# Patient Record
Sex: Female | Born: 1980 | Race: Black or African American | Hispanic: No | Marital: Single | Smoking: Never smoker
Health system: Southern US, Community
[De-identification: ages and names within clinical notes are randomized; demographics above are authoritative.]

## PROBLEM LIST (undated history)

## (undated) DIAGNOSIS — E079 Disorder of thyroid, unspecified: Secondary | ICD-10-CM

## (undated) HISTORY — PX: THYROIDECTOMY: SHX17

---

## 2015-10-06 ENCOUNTER — Emergency Department (HOSPITAL_COMMUNITY): Payer: BC Managed Care – PPO

## 2015-10-06 ENCOUNTER — Emergency Department (HOSPITAL_COMMUNITY)
Admission: EM | Admit: 2015-10-06 | Discharge: 2015-10-07 | Disposition: A | Discharge status: Home / Self Care | Payer: BC Managed Care – PPO | Attending: Emergency Medicine | Admitting: Emergency Medicine

## 2015-10-06 ENCOUNTER — Encounter (HOSPITAL_COMMUNITY): Payer: Self-pay | Admitting: Emergency Medicine

## 2015-10-06 DIAGNOSIS — Y9389 Activity, other specified: Secondary | ICD-10-CM | POA: Diagnosis not present

## 2015-10-06 DIAGNOSIS — Y998 Other external cause status: Secondary | ICD-10-CM | POA: Insufficient documentation

## 2015-10-06 DIAGNOSIS — Y92410 Unspecified street and highway as the place of occurrence of the external cause: Secondary | ICD-10-CM | POA: Insufficient documentation

## 2015-10-06 DIAGNOSIS — M7918 Myalgia, other site: Secondary | ICD-10-CM

## 2015-10-06 DIAGNOSIS — Z331 Pregnant state, incidental: Secondary | ICD-10-CM | POA: Diagnosis not present

## 2015-10-06 DIAGNOSIS — S8992XA Unspecified injury of left lower leg, initial encounter: Secondary | ICD-10-CM | POA: Diagnosis present

## 2015-10-06 DIAGNOSIS — S79912A Unspecified injury of left hip, initial encounter: Secondary | ICD-10-CM | POA: Insufficient documentation

## 2015-10-06 LAB — POC URINE PREG, ED: Preg Test, Ur: POSITIVE — AB

## 2015-10-06 MED ORDER — IBUPROFEN 400 MG PO TABS
400.0000 mg | ORAL_TABLET | Freq: Once | ORAL | Status: AC
Start: 2015-10-06 — End: 2015-10-06
  Administered 2015-10-06: 400 mg via ORAL
  Filled 2015-10-06: qty 1

## 2015-10-06 MED ORDER — HYDROCODONE-ACETAMINOPHEN 5-325 MG PO TABS: ORAL_TABLET | ORAL | Status: AC
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
## 2015-10-06 NOTE — ED Notes (Signed)
Patient returned from xray, awaiting results for poc pregnancy test. Joni ReiningNicole, pa-c, aware.

## 2015-10-06 NOTE — ED Notes (Signed)
Patient transported to X-ray 

## 2015-10-07 NOTE — ED Provider Notes (Signed)
Patient care signed out to me by Wynetta EmeryNicole Pisciotta, PA-C. Please see her note for further detail. Plan is for follow-up on x-rays of lumbar spine with subsequent disposition. Pending pregnancy.  12:15--lab reports pregnancy is positive. X-ray is inquiring about continuing with plain films I discussed the results of the pregnancy test with the patient and she decided to forego further imaging of her spine. Patient reports she feels much better, is ready to be discharged and will follow up with her OB/GYN at home. I encouraged her to continue using Tylenol as needed for her discomfort. Avoid NSAIDs due to newly diagnosed pregnancy. Patient agrees. Overall, patient appears well, nontoxic and is appropriate for discharge. Filed Vitals:   10/06/15 2139 10/07/15 0013  BP: 117/70 105/64  Pulse: 89 87  Temp: 98.2 F (36.8 C) 98.5 F (36.9 C)  TempSrc: Oral Oral  Resp: 26   Height: 5\' 8"  (1.727 m)   Weight: 250 lb 6 oz (113.569 kg)   SpO2: 100% 96%     Joycie PeekBenjamin Miraj Truss, PA-C 10/07/15 [id number]  Pricilla LovelessScott Goldston, MD 10/08/15 [phone]

## 2015-10-07 NOTE — ED Notes (Signed)
Patient declined vicodin prescription due to pregnancy, no xrays per ben, pa-c due to positive pregnancy test. Patient is aware. Planning for discharge.

## 2015-10-07 NOTE — ED Notes (Signed)
Ben, pa-c, at the bedside to update the patient.

## 2015-10-07 NOTE — ED Notes (Signed)
Reported pregnancy test result to ben, pa-c, awaiting new orders.
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
## 2019-09-10 ENCOUNTER — Emergency Department (HOSPITAL_COMMUNITY): Payer: BC Managed Care – PPO

## 2019-09-10 ENCOUNTER — Emergency Department (HOSPITAL_COMMUNITY)
Admission: EM | Admit: 2019-09-10 | Discharge: 2019-09-10 | Disposition: A | Discharge status: Home / Self Care | Payer: BC Managed Care – PPO | Attending: Emergency Medicine | Admitting: Emergency Medicine

## 2019-09-10 ENCOUNTER — Other Ambulatory Visit: Payer: Self-pay

## 2019-09-10 ENCOUNTER — Encounter (HOSPITAL_COMMUNITY): Payer: Self-pay | Admitting: *Deleted

## 2019-09-10 DIAGNOSIS — R0602 Shortness of breath: Secondary | ICD-10-CM | POA: Insufficient documentation

## 2019-09-10 DIAGNOSIS — F419 Anxiety disorder, unspecified: Secondary | ICD-10-CM | POA: Diagnosis not present

## 2019-09-10 HISTORY — DX: Disorder of thyroid, unspecified: E07.9

## 2019-09-10 LAB — BASIC METABOLIC PANEL
Anion gap: 10 (ref 5–15)
BUN: 15 mg/dL (ref 6–20)
CO2: 23 mmol/L (ref 22–32)
Calcium: 9.4 mg/dL (ref 8.9–10.3)
Chloride: 107 mmol/L (ref 98–111)
Creatinine, Ser: 0.88 mg/dL (ref 0.44–1.00)
GFR calc Af Amer: >60 mL/min (ref >60)
GFR calc non Af Amer: >60 mL/min (ref >60)
Glucose, Bld: 111 mg/dL — ABNORMAL HIGH (ref 70–99)
Potassium: 3.5 mmol/L (ref 3.5–5.1)
Sodium: 140 mmol/L (ref 135–145)

## 2019-09-10 LAB — CBC
HCT: 42.5 % (ref 36.0–46.0)
Hemoglobin: 13.8 g/dL (ref 12.0–15.0)
MCH: 29.2 pg (ref 26.0–34.0)
MCHC: 32.5 g/dL (ref 30.0–36.0)
MCV: 89.9 fL (ref 80.0–100.0)
Platelets: 237 10*3/uL (ref 150–400)
RBC: 4.73 MIL/uL (ref 3.87–5.11)
RDW: 13.4 % (ref 11.5–15.5)
WBC: 6.1 10*3/uL (ref 4.0–10.5)
nRBC: 0 % (ref 0.0–0.2)

## 2019-09-10 LAB — TROPONIN I (HIGH SENSITIVITY)
Troponin I (High Sensitivity): 3 ng/L (ref <18)
Troponin I (High Sensitivity): 10 ng/L (ref <18)

## 2019-09-10 LAB — I-STAT BETA HCG BLOOD, ED (MC, WL, AP ONLY): I-stat hCG, quantitative: <5 m[IU]/mL (ref <5)

## 2019-09-10 LAB — D-DIMER, QUANTITATIVE (NOT AT ARMC): D-Dimer, Quant: 0.37 ug/mL-FEU (ref 0.00–0.50)

## 2019-09-10 MED ORDER — ALBUTEROL SULFATE HFA 108 (90 BASE) MCG/ACT IN AERS: 2.0000 | INHALATION_SPRAY | RESPIRATORY_TRACT | 0 refills | Status: AC | PRN

## 2019-09-10 MED ORDER — LORAZEPAM 1 MG PO TABS: 1.0000 mg | ORAL_TABLET | Freq: Once | ORAL | Status: DC

## 2019-09-10 MED ORDER — LORAZEPAM 2 MG/ML IJ SOLN
1.0000 mg | Freq: Once | INTRAMUSCULAR | Status: DC
  Filled 2019-09-10: qty 1

## 2019-09-10 MED ORDER — SODIUM CHLORIDE 0.9% FLUSH: 3.0000 mL | Freq: Once | INTRAVENOUS | Status: DC

## 2019-09-10 NOTE — ED Provider Notes (Signed)
[facility] EMERGENCY DEPARTMENT Provider Note   CSN: [id number] Arrival date & time: 09/10/19  0326     History   Chief Complaint Chief Complaint  Patient presents with  . Shortness of Breath    HPI Hailey Long is a 38 y.o. female.     Patient presents to the emergency department with a chief complaint of shortness of breath and anxiety.  She reports history of asthma.  States that she has had a hard time catching her breath for the past 3 days.  She has run out of her inhaler.  She denies any cough or fever.  Denies any known exposures to coronavirus.  She states that her shortness of breath is causing her to have panic attacks.  She used to be seen by psychiatry, but has not been seen since the start of the pandemic.  She denies any other associated symptoms.  No successful treatments prior to arrival.  The history is provided by the patient. No language interpreter was used.    Past Medical History:  Diagnosis Date  . Thyroid disease     There are no active problems to display for this patient.   Past Surgical History:  Procedure Laterality Date  . THYROIDECTOMY       OB History   No obstetric history on file.      Home Medications    Prior to Admission medications   Medication Sig Start Date End Date Taking? Authorizing Provider  acetaminophen (TYLENOL) 500 MG tablet Take 500 mg by mouth every 6 (six) hours as needed for mild pain.    [provider]  HYDROcodone-acetaminophen (NORCO/VICODIN) 5-325 MG tablet Take 1-2 tablets by mouth every 6 hours as needed for pain and/or cough. 10/06/15   Pisciotta, Mardella Layman    Family History No family history on file.  Social History Social History   Tobacco Use  . Smoking status: Never Smoker  Substance Use Topics  . Alcohol use: No  . Drug use: No     Allergies   Influenza vaccines   Review of Systems Review of Systems  All other systems reviewed and are negative.    Physical Exam Updated Vital Signs BP (!) 155/92 (BP Location: Right Arm)   Pulse (!) 111   Temp 98.6 F (37 C) (Oral)   Resp (!) 22   Ht 5' (1.524 m)   Wt 117.9 kg   SpO2 100%   BMI 50.78 kg/m   Physical Exam Vitals signs and nursing note reviewed.  Constitutional:      General: She is not in acute distress.    Appearance: She is well-developed.     Comments: Extremely anxious  HENT:     Head: Normocephalic and atraumatic.  Eyes:     Conjunctiva/sclera: Conjunctivae normal.  Neck:     Musculoskeletal: Neck supple.  Cardiovascular:     Rate and Rhythm: Normal rate and regular rhythm.     Heart sounds: No murmur.  Pulmonary:     Effort: Pulmonary effort is normal. No respiratory distress.     Breath sounds: Normal breath sounds.     Comments: Lung sounds are clear to auscultation, no wheezing Abdominal:     Palpations: Abdomen is soft.     Tenderness: There is no abdominal tenderness.  Musculoskeletal:     Comments: No pitting edema, no calf swelling, no calf tenderness  Skin:    General: Skin is warm and dry.  Neurological:  Mental Status: She is alert.  Psychiatric:     Comments: anxious      ED Treatments / Results  Labs (all labs ordered are listed, but only abnormal results are displayed) Labs Reviewed  BASIC METABOLIC PANEL - Abnormal; Notable for the following components:      Result Value   Glucose, Bld 111 (*)    All other components within normal limits  CBC  D-DIMER, QUANTITATIVE (NOT AT [facility])  I-STAT BETA HCG BLOOD, ED ([facility], [facility], [facility] ONLY)  TROPONIN I (HIGH SENSITIVITY)  TROPONIN I (HIGH SENSITIVITY)    EKG EKG Interpretation  Date/Time:  Monday September 10 2019 03:39:27 EDT Ventricular Rate:  106 PR Interval:  188 QRS Duration: 76 QT Interval:  342 QTC Calculation: 454 R Axis:   55 Text Interpretation:  Sinus tachycardia Otherwise normal ECG No old tracing to compare Confirmed by Merrily Pew [phone]) on 09/10/2019 3:47:05 AM    Radiology Dg Chest 2 View  Result Date: 09/10/2019 CLINICAL DATA:  Asthma with shortness of breath EXAM: CHEST - 2 VIEW COMPARISON:  None. FINDINGS: The heart size and mediastinal contours are within normal limits. Both lungs are clear. The visualized skeletal structures are unremarkable. IMPRESSION: No active cardiopulmonary disease. Electronically Signed   By: Monte Fantasia M.D.   On: 09/10/2019 04:02    Procedures Procedures (including critical care time)  Medications Ordered in ED Medications  sodium chloride flush (NS) 0.9 % injection 3 mL (has no administration in time range)  LORazepam (ATIVAN) injection 1 mg (has no administration in time range)     Initial Impression / Assessment and Plan / ED Course  I have reviewed the triage vital signs and the nursing notes.  Pertinent labs & imaging results that were available during my care of the patient were reviewed by me and considered in my medical decision making (see chart for details).        Patient with history of asthma.  Comes in with shortness of breath.  Lung sounds are clear.  She is very very anxious.  I believe her anxiety is causing her shortness of breath symptoms.  We will give some Ativan and reassess.  Based on her cancer history, will check d-dimer, however I doubt PE given that she is not hypoxic.  She is tachycardic, but I attribute this to her anxiety.  6:33 AM Patient declines ativan.  D-dimer pending.  Patient signed out to Carole Civil, who will continue care.  Plan: F/u on D-dimer.  DC if negative with instructions to f/u with psych for anxiety.  Final Clinical Impressions(s) / ED Diagnoses   Final diagnoses:  None    ED Discharge Orders    None       Montine Circle, PA-C 09/10/19 7517    Mesner, Corene Cornea, MD 09/10/19 [phone]

## 2019-09-10 NOTE — ED Provider Notes (Signed)
Care assumed from Hailey Long, please see his note for full details, but in brief Hailey Long is a 38 y.o. female who presents with shortness of breath and anxiety.  Known history of asthma but reports over the past 3 days she has had a hard time catching her breath.  She has run out of her inhaler.  No associated fever cough.  Has not followed up with her psychiatrist since the [place] pandemic began.  Work-up thus far has been reassuring, patient tachycardic on arrival but appears extremely anxious.  Negative troponins and lab work reassuring.  Chest x-ray clear and EKG without concerning changes.  D-dimer pending at shift change.  Plan: Follow-up d-dimer, if negative patient can be discharged home with outpatient follow-up with her psychiatrist, she was offered Ativan here in the ED but declined as she needs to drive home afterwards.  Labs Reviewed  BASIC METABOLIC PANEL - Abnormal; Notable for the following components:      Result Value   Glucose, Bld 111 (*)    All other components within normal limits  CBC  D-DIMER, QUANTITATIVE (NOT AT [facility])  I-STAT BETA HCG BLOOD, ED ([facility], [facility], [facility] ONLY)  TROPONIN I (HIGH SENSITIVITY)  TROPONIN I (HIGH SENSITIVITY)   EKG Interpretation  Date/Time:  Monday September 10 2019 03:39:27 EDT Ventricular Rate:  106 PR Interval:  188 QRS Duration: 76 QT Interval:  342 QTC Calculation: 379 R Axis:   55 Text Interpretation:  Sinus tachycardia Otherwise normal ECG No old tracing to compare Confirmed by Merrily Pew [phone]) on 09/10/2019 3:47:05 AM  Dg Chest 2 View  Result Date: 09/10/2019 CLINICAL DATA:  Asthma with shortness of breath EXAM: CHEST - 2 VIEW COMPARISON:  None. FINDINGS: The heart size and mediastinal contours are within normal limits. Both lungs are clear. The visualized skeletal structures are unremarkable. IMPRESSION: No active cardiopulmonary disease. Electronically Signed   By: Monte Fantasia M.D.   On: 09/10/2019 04:02     MDM  Patient's d-dimer has returned negative in addition to the rest of her work-up being very reassuring.  On my evaluation she does appear very anxious.  She is working on getting follow-up with her psychiatrist, she previously took Xanax but has run out of this.  She also reports that she is out of her asthma inhaler, this was refilled for her today.  At this time I feel she is stable for discharge home, I have discussed strict return precautions and outpatient follow-up.  She expresses understanding and agreement with plan.  Discharged home in good condition.  Final diagnoses:  SOB (shortness of breath)  Anxiety    ED Discharge Orders         Ordered    albuterol (VENTOLIN HFA) 108 (90 Base) MCG/ACT inhaler  Every 4 hours PRN     09/10/19 0722            Jacqlyn Larsen, PA-C 09/10/19 7353    Merrily Pew, MD 09/10/19 [phone]

## 2019-09-10 NOTE — ED Triage Notes (Signed)
Pt reporting 3 days of Shortness of breath, chest pain that radiates to the shoulder. Has been using her inhaler has ran out.

## 2019-09-10 NOTE — Discharge Instructions (Addendum)
Your work-up today was reassuring, I think your anxiety is contributing significantly to your symptoms.  Please follow-up with your psychiatrist.  Return to the ED for any new or worsening symptoms.

## 2019-09-10 NOTE — ED Notes (Signed)
States she just ran out of her inhaler today, states she was at work and started getting sob, states she has to sit up at night to breath and was having chest pain. 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

## 2020-09-07 IMAGING — CR DG CHEST 2V
2 series · 2 of 2 positions shown · non-contrast
Comparison: None.

CLINICAL DATA: Asthma with shortness of breath

EXAM:
CHEST - 2 VIEW

[chest pa]
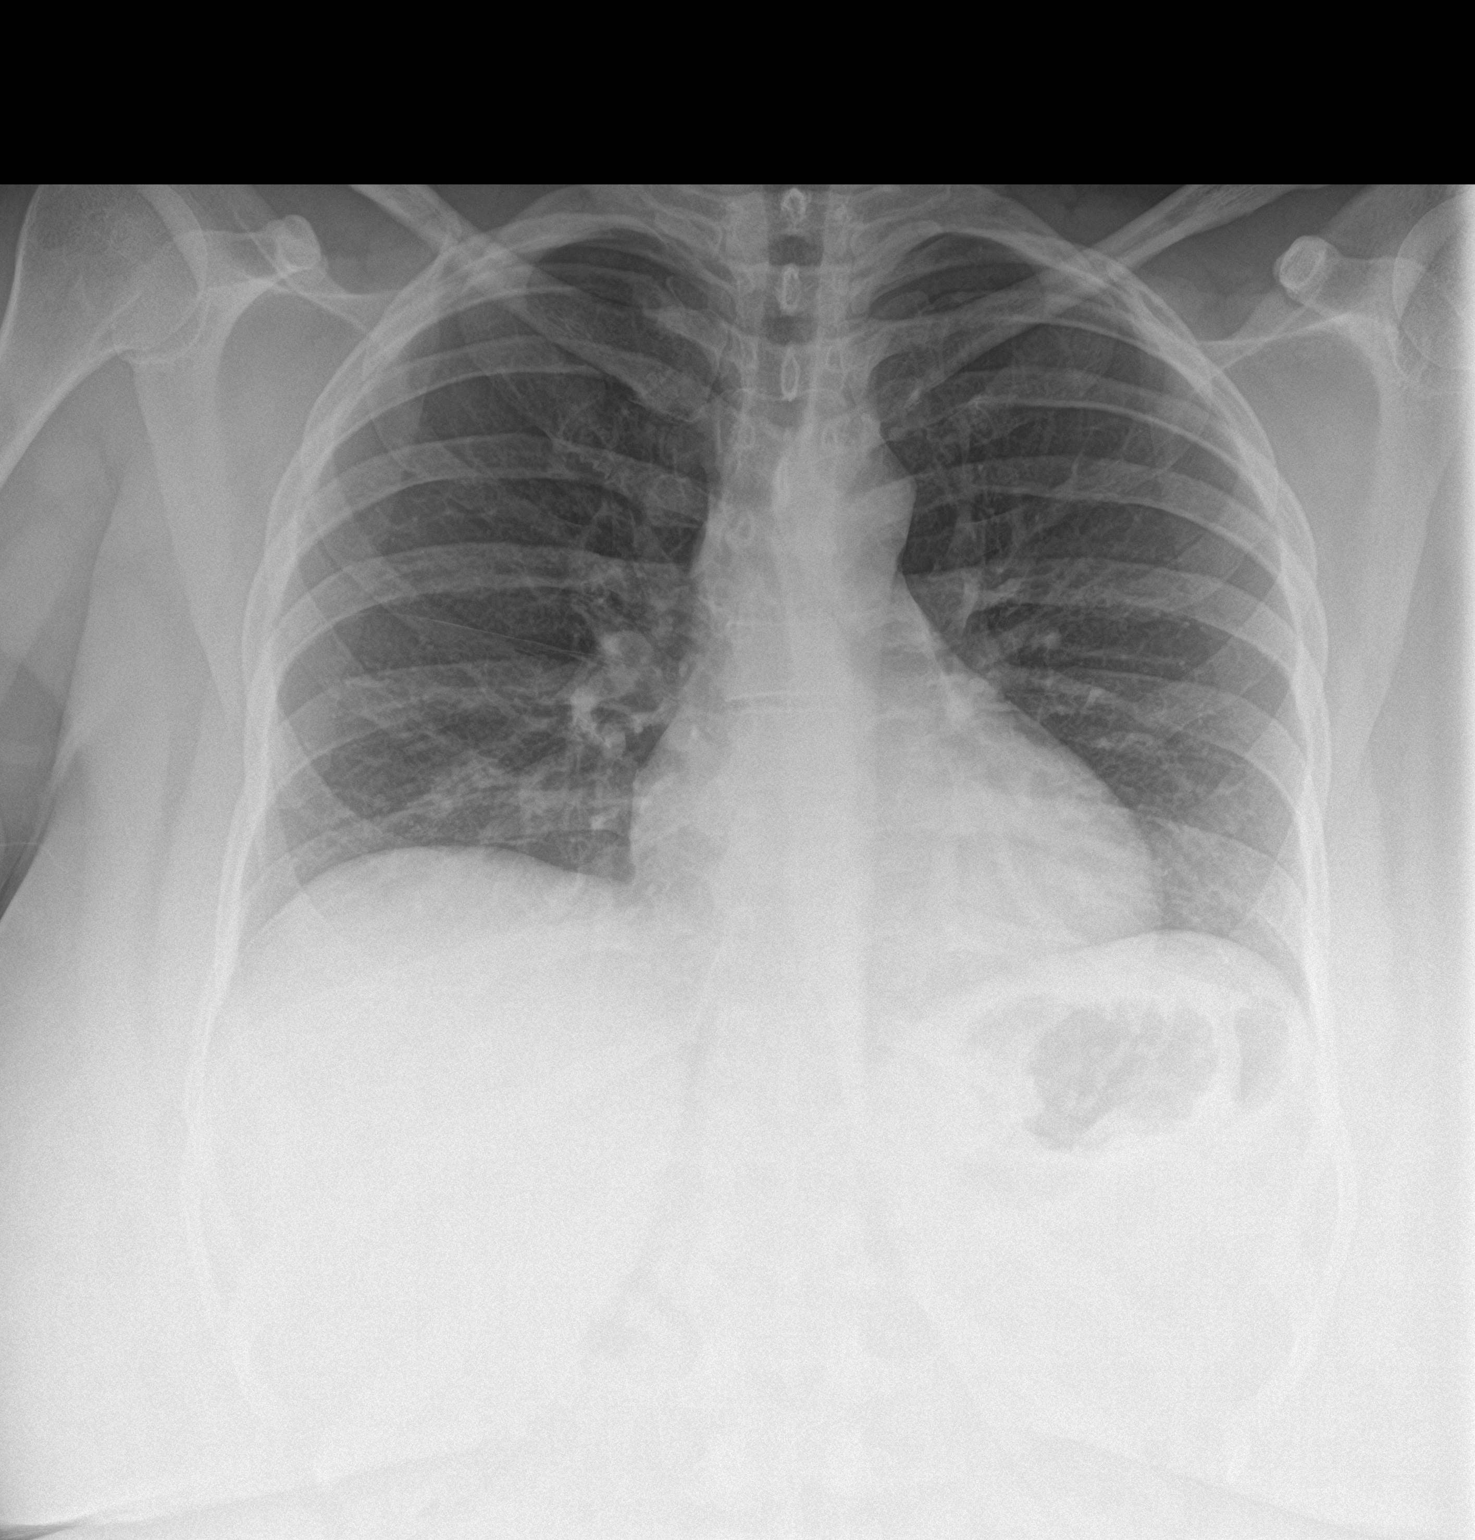

[chest lat]
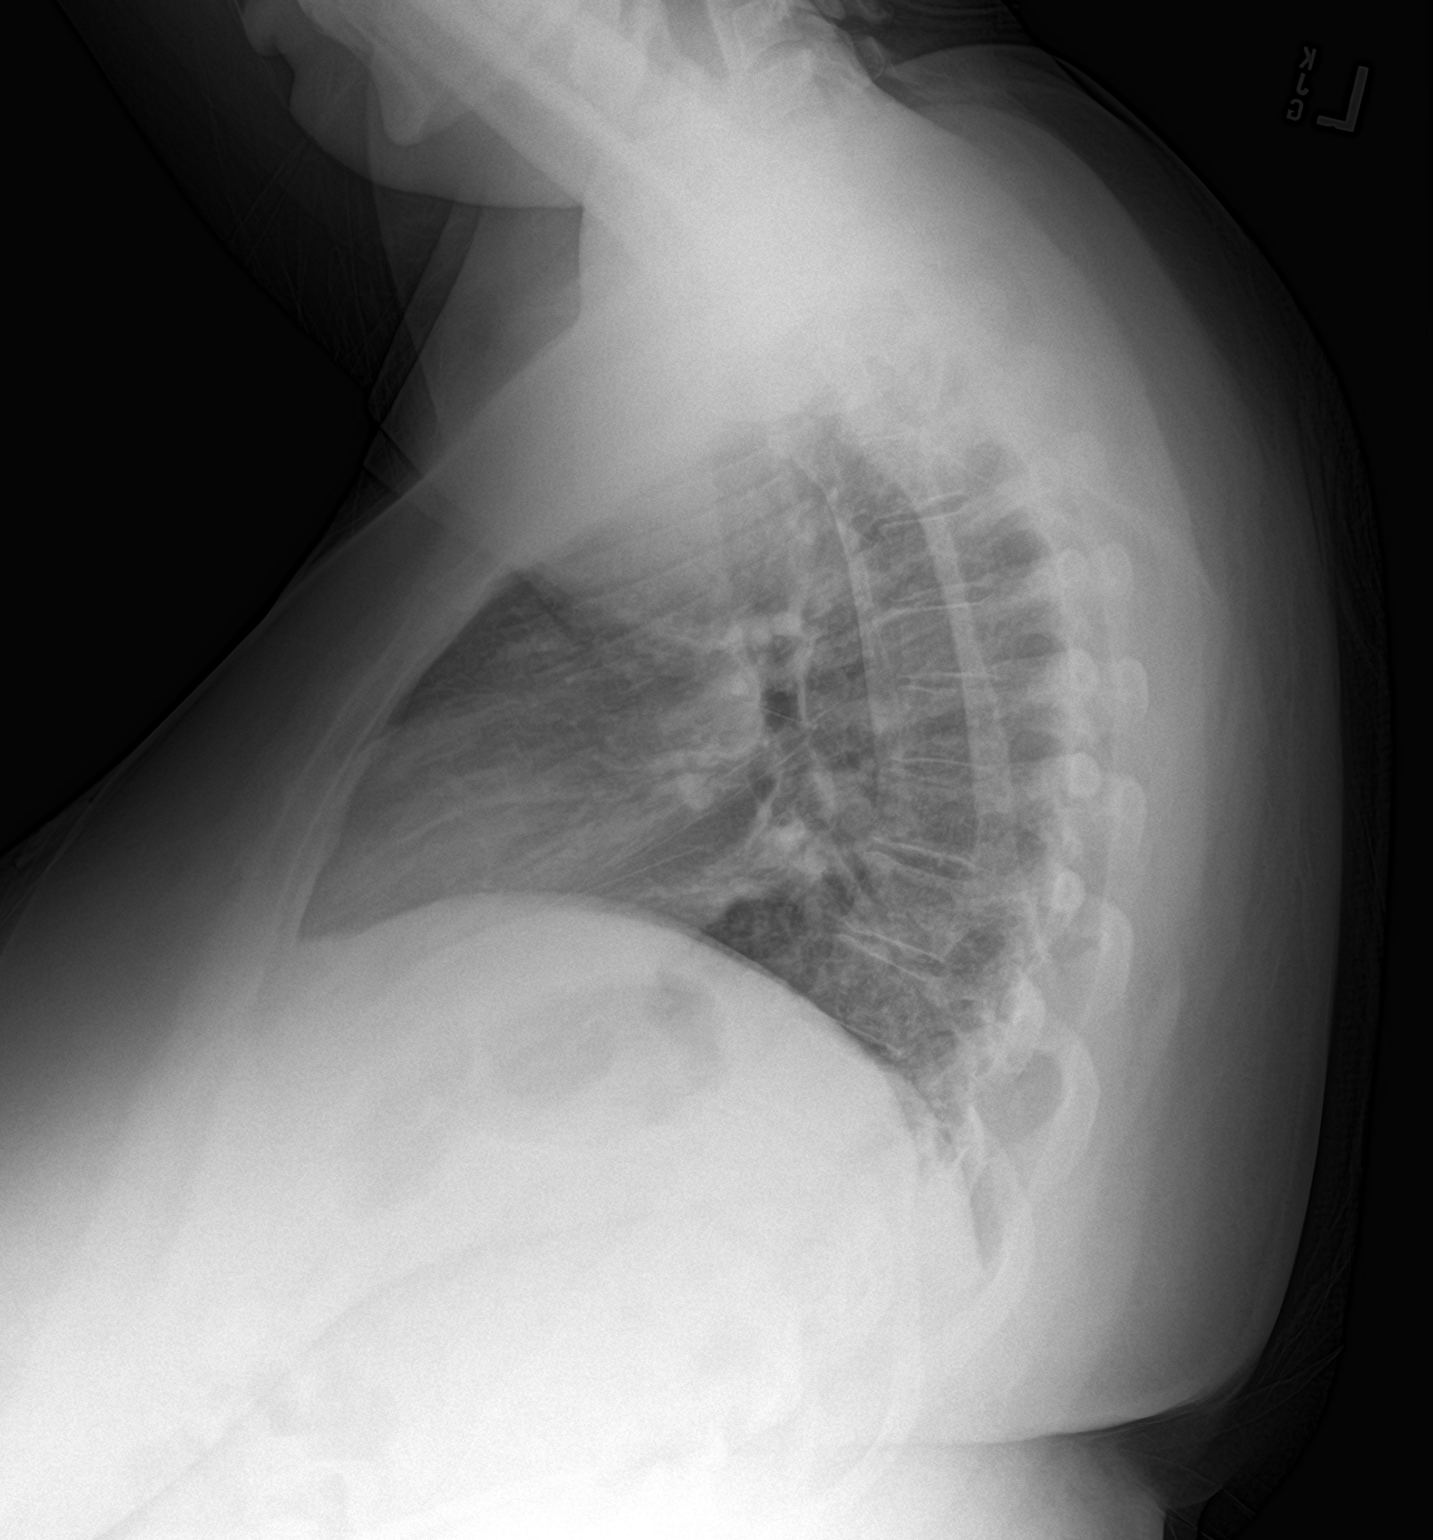

[2 of 2 positions shown; findings below may reference images not displayed]

FINDINGS: The heart size and mediastinal contours are within normal limits.
Both lungs are clear. The visualized skeletal structures are
unremarkable.
IMPRESSION: No active cardiopulmonary disease.
